# Patient Record
Sex: Male | Born: 1985 | Race: Black or African American | Hispanic: No | Marital: Single | Smoking: Current every day smoker
Health system: Southern US, Community
[De-identification: ages and names within clinical notes are randomized; demographics above are authoritative.]

---

## 2005-06-08 ENCOUNTER — Emergency Department (HOSPITAL_COMMUNITY): Admission: EM | Admit: 2005-06-08 | Discharge: 2005-06-08 | Payer: Self-pay | Admitting: Emergency Medicine
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
## 2009-02-14 ENCOUNTER — Emergency Department: Payer: Self-pay | Admitting: Unknown Physician Specialty

## 2011-03-03 IMAGING — CR DG ANKLE COMPLETE 3+V*L*
1 series · 6 of 6 positions shown · non-contrast
Comparison: none

REASON FOR EXAM: injury
COMMENTS:

[Series 1: view not recorded · 0.17mm/px · 6 of 6 slices shown]
[im 1/6]
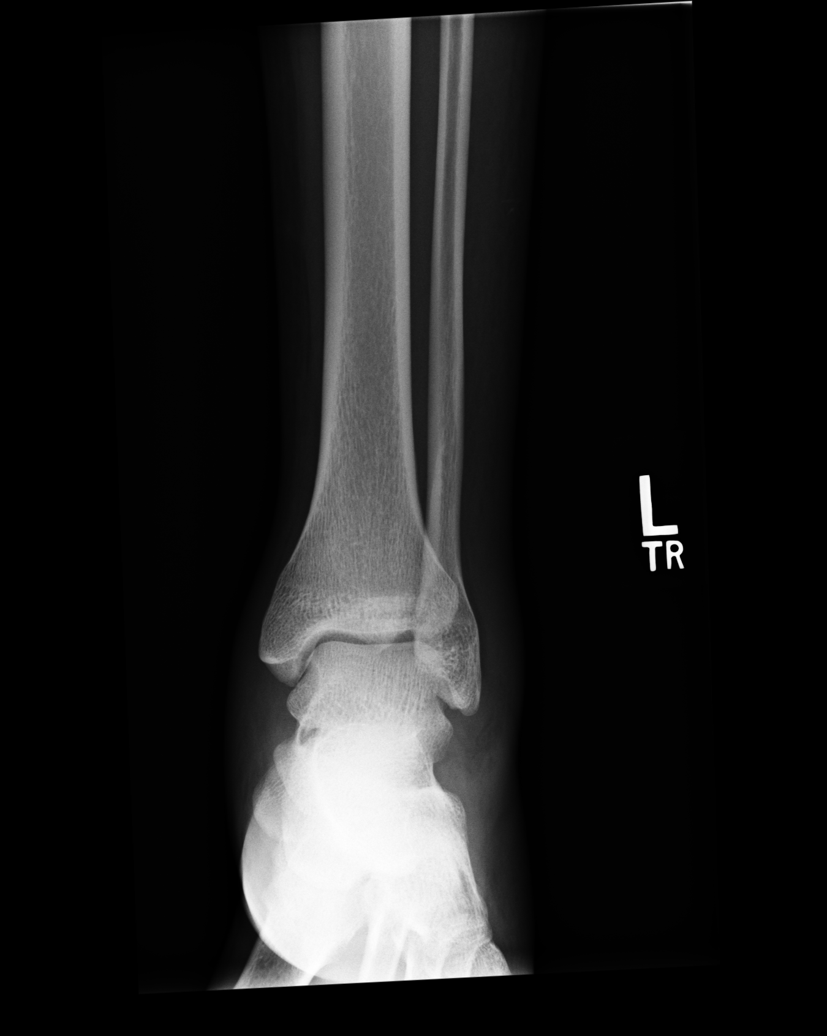
[im 2/6]
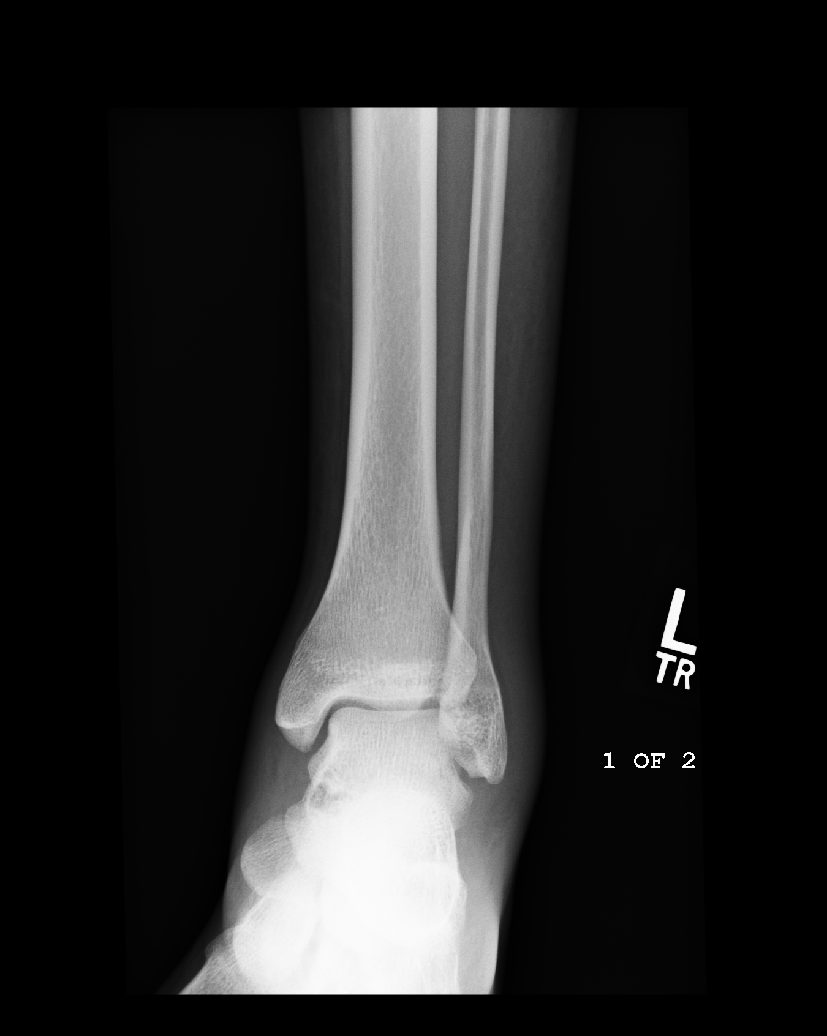
[im 3/6]
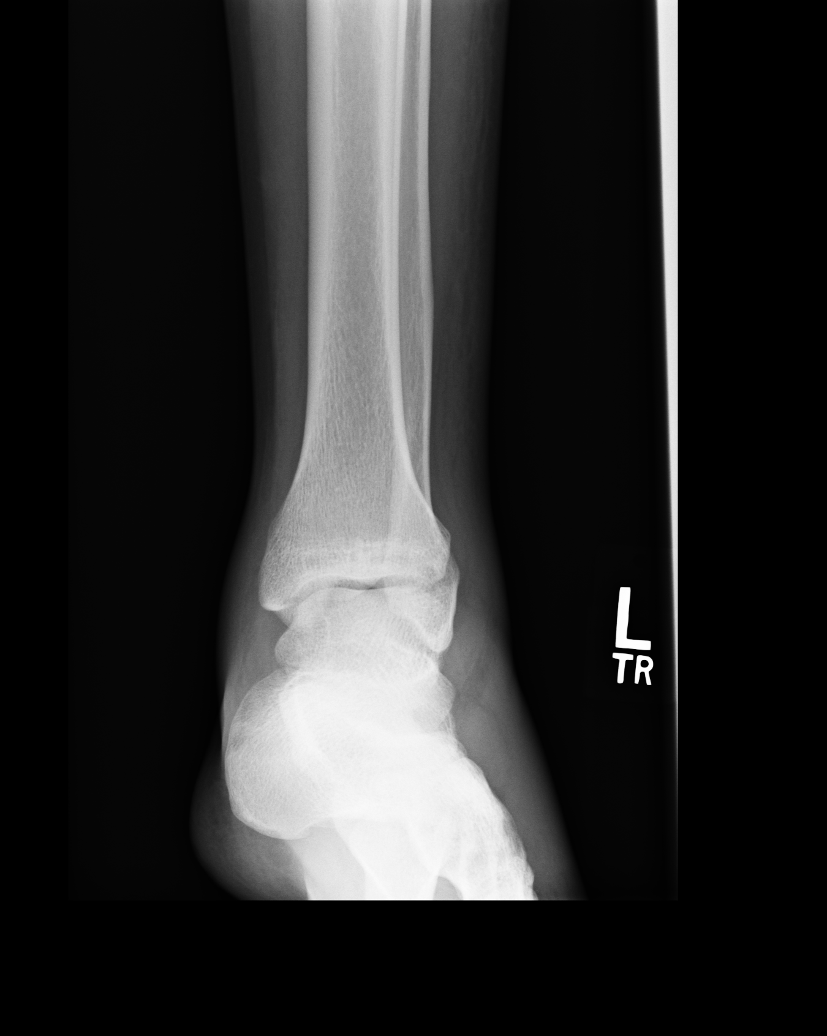
[im 4/6]
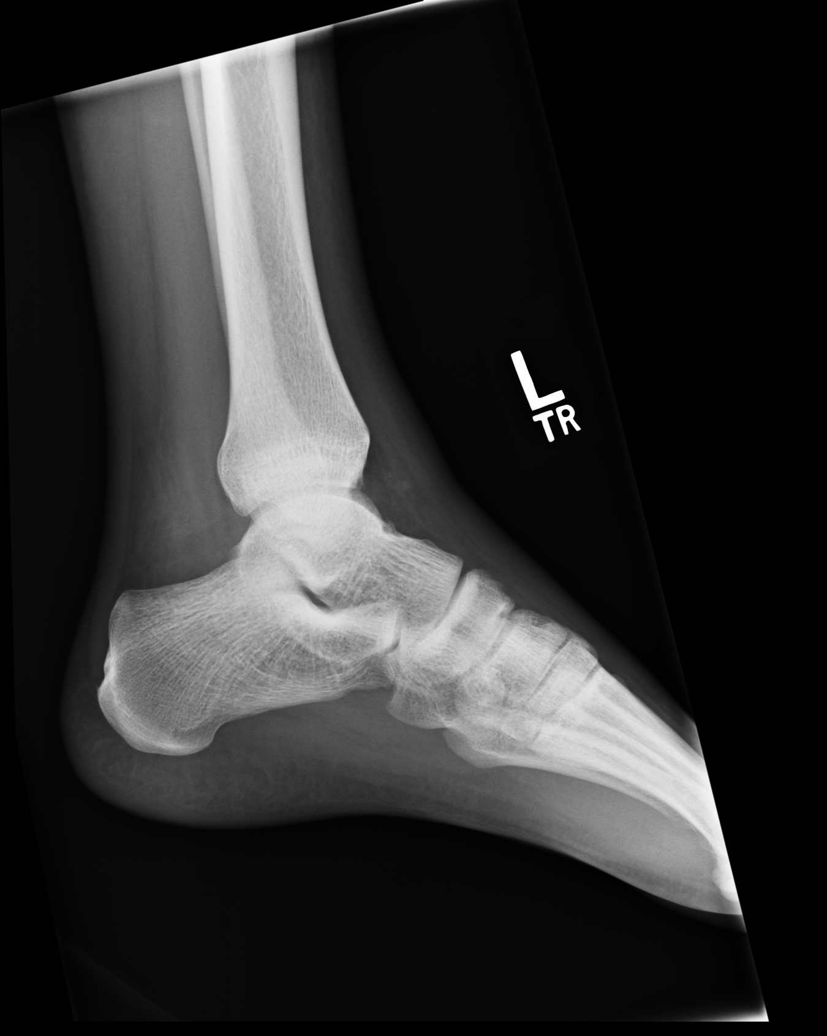
[im 5/6]
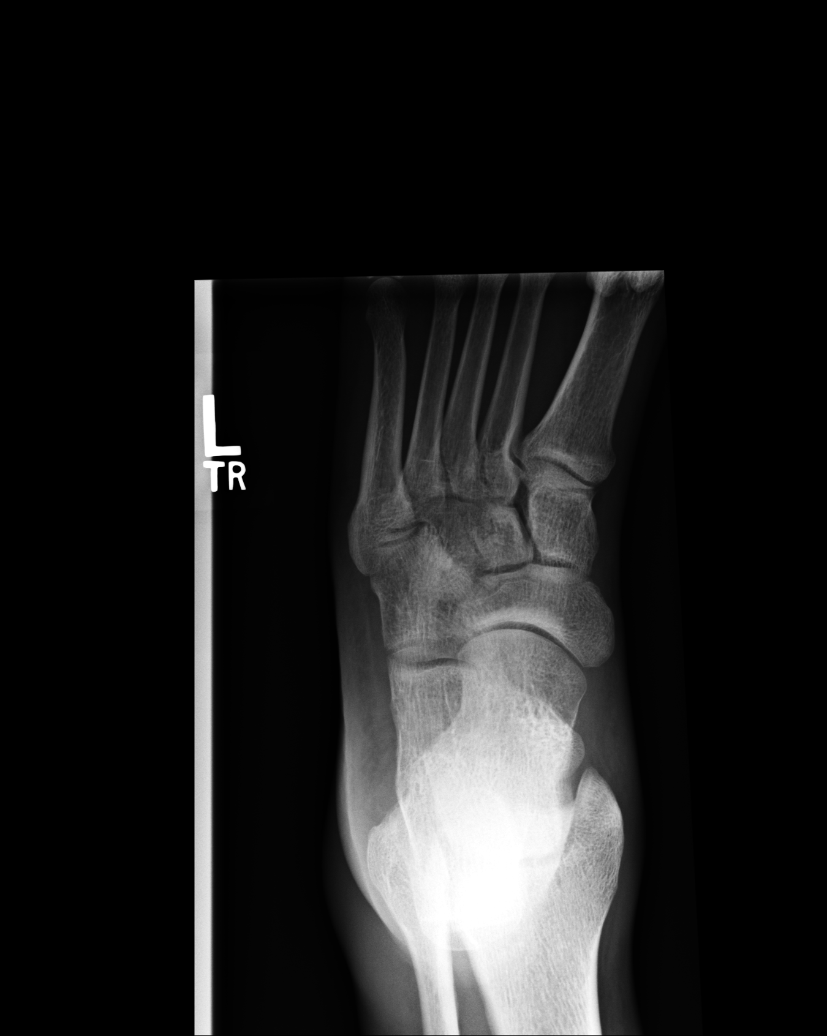
[im 6/6]
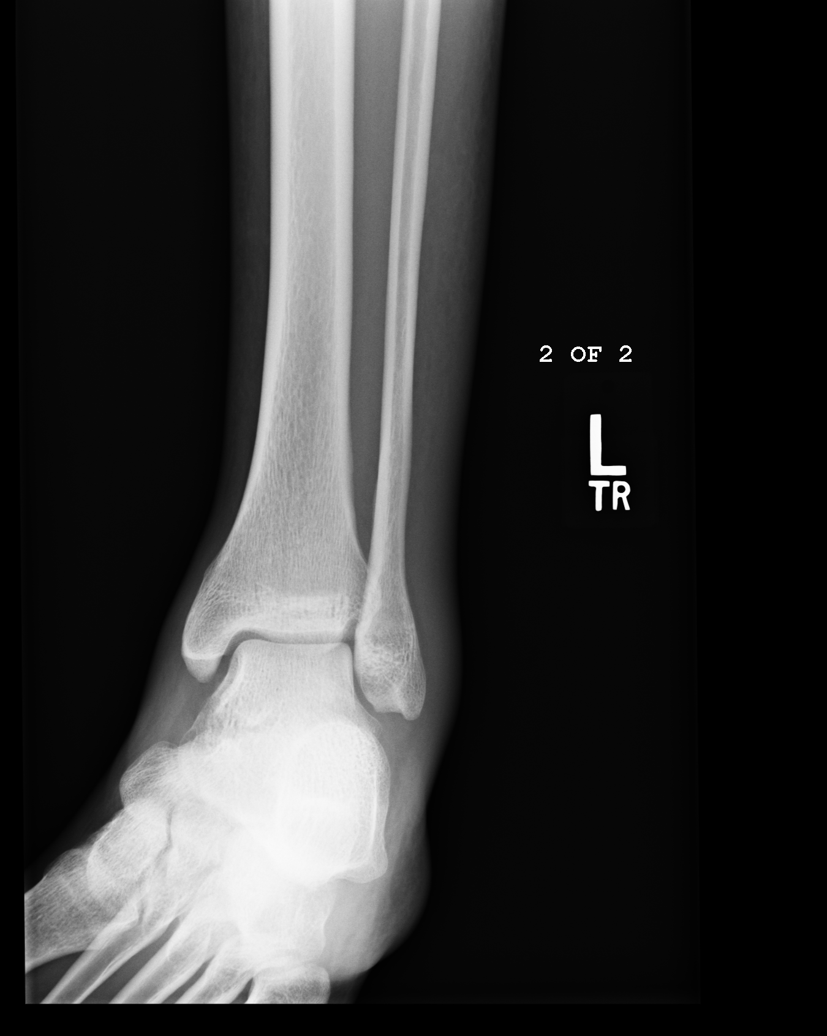

[6 of 6 positions shown; findings below may reference images not displayed]

PROCEDURE:     DXR - DXR ANKLE LEFT COMPLETE  - February 14, 2009  [DATE]

RESULT:     Multiple views of the left ankle reveal soft tissue swelling
over both malleolar regions. The ankle joint mortise is preserved. The talar
dome appears intact. I do not see evidence of an acute malleolar fracture.
The bones of the hindfoot appear intact. The metatarsal bases are intact
where visualized. There is a lucency demonstrated on one view over  the base
of the first metatarsal that likely reflects superimposition of densities.
IMPRESSION: There is soft tissue swelling diffusely over the ankle but
I do not see evidence of an acute ankle fracture or dislocation. Please see
the note above regarding lucency demonstrated over the base of the first
metatarsal. Correlation with any symptoms here is needed. A dedicated foot
series may be of value.

## 2013-11-27 ENCOUNTER — Ambulatory Visit: Payer: Self-pay | Admitting: Podiatry

## 2022-10-02 ENCOUNTER — Telehealth: Payer: Self-pay | Admitting: Emergency Medicine

## 2022-10-02 ENCOUNTER — Encounter: Payer: Self-pay | Admitting: *Deleted

## 2022-10-02 ENCOUNTER — Other Ambulatory Visit: Payer: Self-pay

## 2022-10-02 ENCOUNTER — Emergency Department
Admission: EM | Admit: 2022-10-02 | Discharge: 2022-10-02 | Disposition: A | Discharge status: Home / Self Care | Payer: Self-pay | Attending: Emergency Medicine | Admitting: Emergency Medicine

## 2022-10-02 DIAGNOSIS — K047 Periapical abscess without sinus: Secondary | ICD-10-CM

## 2022-10-02 MED ORDER — ONDANSETRON 8 MG PO TBDP: 8.0000 mg | ORAL_TABLET | Freq: Three times a day (TID) | ORAL | 0 refills | Status: AC | PRN

## 2022-10-02 MED ORDER — IBUPROFEN 600 MG PO TABS: 600.0000 mg | ORAL_TABLET | Freq: Once | ORAL | Status: DC

## 2022-10-02 MED ORDER — ONDANSETRON 4 MG PO TBDP: 4.0000 mg | ORAL_TABLET | Freq: Three times a day (TID) | ORAL | 0 refills | Status: DC | PRN

## 2022-10-02 MED ORDER — HYDROCODONE-ACETAMINOPHEN 5-325 MG PO TABS: 2.0000 | ORAL_TABLET | Freq: Once | ORAL | Status: DC

## 2022-10-02 MED ORDER — AMOXICILLIN-POT CLAVULANATE 875-125 MG PO TABS: 1.0000 | ORAL_TABLET | Freq: Two times a day (BID) | ORAL | 0 refills | Status: AC

## 2022-10-02 MED ORDER — AMOXICILLIN-POT CLAVULANATE 875-125 MG PO TABS: 1.0000 | ORAL_TABLET | Freq: Two times a day (BID) | ORAL | 0 refills | Status: DC

## 2022-10-02 MED ORDER — IBUPROFEN 800 MG PO TABS
800.0000 mg | ORAL_TABLET | Freq: Once | ORAL | Status: AC
  Administered 2022-10-02: 800 mg via ORAL
  Filled 2022-10-02: qty 1

## 2022-10-02 MED ORDER — AMOXICILLIN-POT CLAVULANATE 875-125 MG PO TABS
1.0000 | ORAL_TABLET | Freq: Once | ORAL | Status: AC
  Administered 2022-10-02: 1 via ORAL
  Filled 2022-10-02: qty 1

## 2022-10-02 MED ORDER — ONDANSETRON 4 MG PO TBDP: 4.0000 mg | ORAL_TABLET | Freq: Once | ORAL | Status: DC

## 2022-10-02 MED ORDER — BUTAMBEN-TETRACAINE-BENZOCAINE 2-2-14 % EX AERO
1.0000 | INHALATION_SPRAY | Freq: Once | CUTANEOUS | Status: AC
  Administered 2022-10-02: 1 via TOPICAL
  Filled 2022-10-02: qty 5

## 2022-10-02 MED ORDER — IBUPROFEN 600 MG PO TABS: 600.0000 mg | ORAL_TABLET | Freq: Four times a day (QID) | ORAL | 0 refills | Status: DC | PRN

## 2022-10-02 MED ORDER — IBUPROFEN 600 MG PO TABS: 600.0000 mg | ORAL_TABLET | Freq: Four times a day (QID) | ORAL | 0 refills | Status: AC | PRN

## 2022-10-02 MED ORDER — LIDOCAINE-EPINEPHRINE 2 %-1:100000 IJ SOLN
1.7000 mL | Freq: Once | INTRAMUSCULAR | Status: AC
  Administered 2022-10-02: 1.7 mL via INTRADERMAL
  Filled 2022-10-02: qty 1

## 2022-10-02 NOTE — Discharge Instructions (Signed)
OPTIONS FOR DENTAL FOLLOW UP CARE ° °[place] [facility] - Local Safety Net Dental Clinics °http://www.ncdhhs.gov/dph/oralhealth/services/safetynetclinics.htm °  °[facility] (336-562-3123) ° °[facility] (919-933-9087) ° °[facility] ([phone] ext [phone]) ° °[facility] ([phone]) ° °[facility] ([phone]) °This clinic caters to the indigent population and is on a lottery system. °Location: °[facility], [address], [place] °Clinic Hours: °Wednesdays from 6pm - 9pm, patients seen by a lottery system. °For dates, call or go to [email or web address].[facility].edu/[facility]/patients/Dental-[facility] °Services: °Cleanings, fillings and simple extractions. °Payment Options: °DENTAL WORK IS FREE OF CHARGE. Bring proof of income or support. °Best way to get seen: °Arrive at 5:15 pm - this is a lottery, NOT first come/first serve, so arriving earlier will not increase your chances of being seen. °  °  °[facility] °[phone] °Select option 1 for emergencies °  °Location: °[facility], [address], [place] °Clinic Hours: °No walk-ins accepted - call the day before to schedule an appointment. °Check in times are 9:30 am and 1:30 pm. °Services: °Simple extractions, temporary fillings, pulpectomy/pulp debridement, uncomplicated abscess drainage. °Payment Options: °PAYMENT IS DUE AT THE TIME OF SERVICE.  Fee is usually $100-200, additional surgical procedures (e.g. abscess drainage) may be extra. °Cash, checks, Visa/MasterCard accepted.  Can file Medicaid if patient is covered for dental - patient should call case worker to check. °No discount for [facility] patients. °Best way to get seen: °MUST call the day before and get onto the schedule. Can usually be seen the next 1-2 days. No walk-ins accepted. °  °  °[facility] °[phone] °   °Location: °[facility], [address], [place] °Clinic Hours: °M, W, Th, F 8am or 1:30pm, Tues 9a or 1:30 - first come/first served. °Services: °Simple extractions, temporary fillings, uncomplicated abscess drainage.  You do not need to be an [place] resident. °Payment Options: °PAYMENT IS DUE AT THE TIME OF SERVICE. °Dental insurance, otherwise sliding scale - bring proof of income or support. °Depending on income and treatment needed, cost is usually $50-200. °Best way to get seen: °Arrive early as it is first come/first served. °  °  °[facility] °[phone] °  °Location: °[address] °Clinic Hours: °Mon-Thu 8a-5p °Services: °Most basic dental services including extractions and fillings. °Payment Options: °PAYMENT IS DUE AT THE TIME OF SERVICE. °Sliding scale, up to 50% off - bring proof if income or support. °Medicaid with dental option accepted. °Best way to get seen: °Call to schedule an appointment, can usually be seen within 2 weeks OR they will try to see walk-ins - show up at 8a or 2p (you may have to wait). °  °  °[facility] °919-245-2435 °[place] RESIDENTS ONLY °  °Location: °[facility], [address], [place], [place] [address] °Clinic Hours: By appointment only. °Monday - Thursday 8am-5pm, Friday 8am-12pm °Services: Cleanings, fillings, extractions. °Payment Options: °PAYMENT IS DUE AT THE TIME OF SERVICE. °Cash, Visa or MasterCard. Sliding scale - $30 minimum per service. °Best way to get seen: °Come in to office, complete packet and make an appointment - need proof of income °or support monies for each household member and proof of [place] residence. °Usually takes about a month to get in. °  °  °[facility] °[phone] °  °Location: °[address]   [place] °Clinic Hours: Walk-in Urgent Care Dental Services are offered Monday-Friday  mornings only. °The numbers of emergencies accepted daily is limited to the number of °providers available. °Maximum 15 - Mondays, Wednesdays & Thursdays °Maximum 10 - Tuesdays & Fridays °Services: °You do not need to be a [place] resident to be seen for a dental emergency. °Emergencies are defined as pain, swelling, abnormal bleeding, or dental trauma. Walkins will receive x-rays if needed. °NOTE: Dental cleaning is not an emergency. °Payment Options: °PAYMENT IS DUE AT THE TIME OF SERVICE. °Minimum co-pay is $40.00 for uninsured patients. °Minimum co-pay is $3.00 for Medicaid with dental coverage. °Dental Insurance is accepted and must be presented at time of visit. °Medicare does not cover dental. °Forms of payment: Cash, credit card, checks. °Best way to get seen: °If not previously registered with the clinic, walk-in dental registration begins at 7:15 am and is on a first come/first serve basis. °If previously registered with the clinic, call to make an appointment. °  °  °[facility] °[phone] °[place] RESIDENTS ONLY °  °Location: °[address], [place], [place] °Clinic Hours: °Mon-Thu 10a-2p °Services: Extractions only! °Payment Options: °FREE (donations accepted) - bring proof of income or support °Best way to get seen: °Call and schedule an appointment OR come at 8am on the 1st Monday of every month (except for holidays) when it is first come/first served. °  °  °[facility] °[phone] °  °Location: °[address], [place] °Clinic Hours: °Friday mornings °Services, Payment Options, Best way to get seen: °Call for info °

## 2022-10-02 NOTE — ED Provider Notes (Signed)
[facility] Provider Note    Event Date/Time   First MD Initiated Contact with Patient 10/02/22 [phone]     (approximate)   History   Dental Pain   HPI  Tyler Lang is a 37 y.o. male  here with dental pain. Pt reports that for the past several days he has had aching, throbbing right lower dental pain. Over past 24 hours this has acutely worsened and he has had chills, swelling of his face and jaw. No swelling of his tongue. No h/o recurrent dental infections. States his right lower molar did chip at some point and he believes he has a "deep" part of it left. No regular dental care.       Physical Exam   Triage Vital Signs: ED Triage Vitals  Enc Vitals Group     BP 10/02/22 0210 (!) 163/98     Pulse Rate 10/02/22 0210 89     Resp 10/02/22 0210 18     Temp 10/02/22 0210 (!) 100.4 F (38 C)     Temp Source 10/02/22 0210 Oral     SpO2 10/02/22 0210 100 %     Weight 10/02/22 0211 200 lb (90.7 kg)     Height 10/02/22 0211 6\' 2"  (1.88 m)     Head Circumference --      Peak Flow --      Pain Score 10/02/22 0211 9     Pain Loc --      Pain Edu? --      Excl. in GC? --     Most recent vital signs: Vitals:   10/02/22 0210 10/02/22 0611  BP: (!) 163/98   Pulse: 89   Resp: 18   Temp: (!) 100.4 F (38 C) 99.2 F (37.3 C)  SpO2: 100%      General: Awake, no distress.  CV:  Good peripheral perfusion.  Resp:  Normal work of breathing.  Abd:  No distention.  Other:  Poor dentition diffusely, with fractured right lower premolar with large gingival abscess extending to jaw. Mild facial swelling. No major sublingual or submandibular swelling or induration. OP is patent. Tolerating secretions. Uvula is midline.   ED Results / Procedures / Treatments   Labs (all labs ordered are listed, but only abnormal results are displayed) Labs Reviewed - No data to display   EKG    RADIOLOGY    I also independently reviewed and agree with  radiologist interpretations.   PROCEDURES:  Critical Care performed: No  Dental Block  Date/Time: 10/02/2022 6:50 AM  Performed by: Shaune Pollack, MD Authorized by: Shaune Pollack, MD   Consent:    Consent obtained:  Written and verbal   Consent given by:  Patient   Risks, benefits, and alternatives were discussed: yes     Risks discussed:  Allergic reaction, hematoma, infection, intravascular injection, nerve damage, pain, swelling and unsuccessful block   Alternatives discussed:  Alternative treatment Universal protocol:    Patient identity confirmed:  Verbally with patient Indications:    Indications: dental abscess   Location:    Block type:  Inferior alveolar   Laterality:  Right Procedure details:    Topical anesthetic:  Benzocaine spray   Syringe type:  Controlled syringe   Needle gauge:  24 G   Anesthetic injected:  Bupivacaine 0.5% w/o epi   Injection procedure:  Anatomic landmarks identified Post-procedure details:    Outcome:  Anesthesia achieved   Procedure completion:  Tolerated .Marland KitchenIncision and Drainage  Date/Time: 10/02/2022 6:51 AM  Performed by: Shaune Pollack, MD Authorized by: Shaune Pollack, MD   Consent:    Consent obtained:  Verbal   Consent given by:  Patient   Risks discussed:  Bleeding, damage to other organs, incomplete drainage, infection and pain   Alternatives discussed:  Alternative treatment and delayed treatment Location:    Type:  Abscess   Location:  Mouth   Mouth location:  Alveolar process Anesthesia:    Anesthesia method:  Local infiltration   Local anesthetic:  Lidocaine 1% WITH epi Procedure type:    Complexity:  Simple Procedure details:    Incision types:  Single straight   Incision depth:  Dermal   Wound management:  Probed and deloculated and irrigated with saline   Drainage:  Purulent   Drainage amount:  Copious   Wound treatment:  Wound left open Post-procedure details:    Procedure completion:  Tolerated  well, no immediate complications     MEDICATIONS ORDERED IN ED: Medications  amoxicillin-clavulanate (AUGMENTIN) 875-125 MG per tablet 1 tablet (has no administration in time range)  ibuprofen (ADVIL) tablet 800 mg (has no administration in time range)  lidocaine-EPINEPHrine (XYLOCAINE W/EPI) 2 %-1:100000 (with pres) injection 1.7 mL (1.7 mLs Intradermal Given by Other 10/02/22 1610)  butamben-tetracaine-benzocaine (CETACAINE) spray 1 spray (1 spray Topical Given by Other 10/02/22 9604)     IMPRESSION / MDM / ASSESSMENT AND PLAN / ED COURSE  I reviewed the triage vital signs and the nursing notes.                              Differential diagnosis includes, but is not limited to, dental abscess, reversible pulpitis, facial abscess, sialadenitis  Patient's presentation is most consistent with acute presentation with potential threat to life or bodily function.  37 yo M here with dental abscess and swelling. Exam as above. No signs of sublingual swelling, Ludwig's, or extension to submandibular or sublingual spaces. Posterior pharynx normal with no signs of peritonsillar, tonsillar or airway involvement. After consent, dental block performed and I&D performed with expression of large amount of purulence and marked improvement in sx and swelling. No h/o diabetes or immunosuppression. Will place on augmentin, d/c with dental f/u. Return precautions given.     FINAL CLINICAL IMPRESSION(S) / ED DIAGNOSES   Final diagnoses:  Dental abscess     Rx / DC Orders   ED Discharge Orders          Ordered    amoxicillin-clavulanate (AUGMENTIN) 875-125 MG tablet  2 times daily        10/02/22 0648    ibuprofen (ADVIL) 600 MG tablet  Every 6 hours PRN        10/02/22 0648    ondansetron (ZOFRAN-ODT) 4 MG disintegrating tablet  Every 8 hours PRN        10/02/22 5409             Note:  This document was prepared using Dragon voice recognition software and may include unintentional  dictation errors.   Shaune Pollack, MD 10/02/22 [phone]

## 2022-10-02 NOTE — ED Notes (Signed)
Pt given warm blanket.

## 2022-10-02 NOTE — Telephone Encounter (Signed)
Patient did not get his paper descriptions.  Charge nurse informed me that they are to be sent to the [facility] on [address].  I canceled the prescriptions from last night and resent new ones for Augmentin, ibuprofen, and Zofran to the [facility].  I returned the patient's call to inform him that the prescriptions were sent.

## 2022-10-02 NOTE — ED Triage Notes (Addendum)
Pt has right lower tooth pain.  Swelling noted to right side of jaw area. .  Pt taking motrin without relief.  Pt alert.

## 2023-02-01 ENCOUNTER — Other Ambulatory Visit: Payer: Self-pay

## 2023-02-01 ENCOUNTER — Emergency Department
Admission: EM | Admit: 2023-02-01 | Discharge: 2023-02-01 | Disposition: A | Discharge status: Home / Self Care | Payer: Self-pay | Attending: Emergency Medicine | Admitting: Emergency Medicine

## 2023-02-01 DIAGNOSIS — K047 Periapical abscess without sinus: Secondary | ICD-10-CM | POA: Insufficient documentation

## 2023-02-01 DIAGNOSIS — K0889 Other specified disorders of teeth and supporting structures: Secondary | ICD-10-CM | POA: Diagnosis present

## 2023-02-01 MED ORDER — HYDROCODONE-ACETAMINOPHEN 5-325 MG PO TABS: 1.0000 | ORAL_TABLET | ORAL | 0 refills | Status: DC | PRN

## 2023-02-01 MED ORDER — AMOXICILLIN 875 MG PO TABS: 875.0000 mg | ORAL_TABLET | Freq: Two times a day (BID) | ORAL | 0 refills | Status: AC

## 2023-02-01 MED ORDER — HYDROCODONE-ACETAMINOPHEN 5-325 MG PO TABS: 1.0000 | ORAL_TABLET | ORAL | 0 refills | Status: AC | PRN

## 2023-02-01 MED ORDER — AMOXICILLIN 500 MG PO CAPS
1000.0000 mg | ORAL_CAPSULE | Freq: Once | ORAL | Status: AC
  Administered 2023-02-01: 1000 mg via ORAL
  Filled 2023-02-01: qty 2

## 2023-02-01 NOTE — ED Provider Notes (Signed)
[facility] Provider Note  Patient Contact: 3:44 PM (approximate)   History   Dental Problem   HPI  Tyler Lang is a 37 y.o. male who presents emergency department complaining of right lower dental pain.  Patient has a history of poor dentition, states that due to the cost he has not had the dental work performed.  Patient started to have increasing pain, swelling to the right lower dentition.  No fevers or chills, difficulty breathing or swallowing.     Physical Exam   Triage Vital Signs: ED Triage Vitals  Encounter Vitals Group     BP 02/01/23 1415 (!) 136/104     Systolic BP Percentile --      Diastolic BP Percentile --      Pulse Rate 02/01/23 1415 83     Resp 02/01/23 1415 18     Temp 02/01/23 1415 98 F (36.7 C)     Temp src --      SpO2 02/01/23 1415 100 %     Weight 02/01/23 1412 199 lb 15.3 oz (90.7 kg)     Height 02/01/23 1412 6\' 2"  (1.88 m)     Head Circumference --      Peak Flow --      Pain Score 02/01/23 1412 7     Pain Loc --      Pain Education --      Exclude from Growth Chart --     Most recent vital signs: Vitals:   02/01/23 1415  BP: (!) 136/104  Pulse: 83  Resp: 18  Temp: 98 F (36.7 C)  SpO2: 100%     General: Alert and in no acute distress. ENT:      Ears:       Nose: No congestion/rhinnorhea.      Mouth/Throat: Mucous membranes are moist.  Visualization of the right lower dentition reveals multiple erosions, caries.  There is edema along the right gum without appreciable fluctuant area for superficial abscess.  No uvular deviation.  No submandibular edema or erythema. Neck: No stridor. No cervical spine tenderness to palpation.  Cardiovascular:  Good peripheral perfusion Respiratory: Normal respiratory effort without tachypnea or retractions. Lungs CTAB. Musculoskeletal: Full range of motion to all extremities.  Neurologic:  No gross focal neurologic deficits are appreciated.  Skin:   No rash  noted Other:   ED Results / Procedures / Treatments   Labs (all labs ordered are listed, but only abnormal results are displayed) Labs Reviewed - No data to display   EKG     RADIOLOGY    No results found.  PROCEDURES:  Critical Care performed: No  Procedures   MEDICATIONS ORDERED IN ED: Medications  amoxicillin (AMOXIL) capsule 1,000 mg (has no administration in time range)     IMPRESSION / MDM / ASSESSMENT AND PLAN / ED COURSE  I reviewed the triage vital signs and the nursing notes.                                 Differential diagnosis includes, but is not limited to, still infection, dental abscess, sialoadenitis, Ludwick's angina   Patient's presentation is most consistent with acute presentation with potential threat to life or bodily function.   Patient's diagnosis is consistent with dental infection.  Patient presents emergency department with right lower dental pain and edema.  Findings are consistent with dental infection.  No appreciable drainable  abscess on exam.  No indication for labs or imaging currently.  Antibiotics, limited pain medication.  Follow-up with dentist.  Return precautions discussed with the patient..  Patient is given ED precautions to return to the ED for any worsening or new symptoms.     FINAL CLINICAL IMPRESSION(S) / ED DIAGNOSES   Final diagnoses:  Dental abscess     Rx / DC Orders   ED Discharge Orders          Ordered    amoxicillin (AMOXIL) 875 MG tablet  2 times daily        02/01/23 1548    HYDROcodone-acetaminophen (NORCO/VICODIN) 5-325 MG tablet  Every 4 hours PRN,   Status:  Discontinued        02/01/23 1548    HYDROcodone-acetaminophen (NORCO/VICODIN) 5-325 MG tablet  Every 4 hours PRN        02/01/23 1548             Note:  This document was prepared using Dragon voice recognition software and may include unintentional dictation errors.   Lanette Hampshire 02/01/23 1548     Dionne Bucy, MD 02/01/23 [phone]

## 2023-02-01 NOTE — ED Triage Notes (Signed)
Right lower jaw dental swelling.
